# Patient Record
Sex: Female | Born: 2000 | Race: White | Hispanic: No | Marital: Single | Smoking: Never smoker
Health system: Southern US, Community
[De-identification: ages and names within clinical notes are randomized; demographics above are authoritative.]

## PROBLEM LIST (undated history)

## (undated) DIAGNOSIS — J45909 Unspecified asthma, uncomplicated: Secondary | ICD-10-CM

## (undated) DIAGNOSIS — J189 Pneumonia, unspecified organism: Secondary | ICD-10-CM

## (undated) HISTORY — DX: Pneumonia, unspecified organism: J18.9

---
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
## 2021-06-20 ENCOUNTER — Ambulatory Visit: Payer: 59 | Admitting: Osteopathic Medicine

## 2021-06-27 ENCOUNTER — Ambulatory Visit (INDEPENDENT_AMBULATORY_CARE_PROVIDER_SITE_OTHER): Payer: BC Managed Care – PPO

## 2021-06-27 ENCOUNTER — Other Ambulatory Visit: Payer: Self-pay

## 2021-06-27 ENCOUNTER — Ambulatory Visit (INDEPENDENT_AMBULATORY_CARE_PROVIDER_SITE_OTHER): Payer: BC Managed Care – PPO | Admitting: Sports Medicine

## 2021-06-27 DIAGNOSIS — M25551 Pain in right hip: Secondary | ICD-10-CM

## 2021-06-27 DIAGNOSIS — M25552 Pain in left hip: Secondary | ICD-10-CM | POA: Diagnosis not present

## 2021-06-27 DIAGNOSIS — Q742 Other congenital malformations of lower limb(s), including pelvic girdle: Secondary | ICD-10-CM

## 2021-06-27 DIAGNOSIS — G8929 Other chronic pain: Secondary | ICD-10-CM

## 2021-06-27 DIAGNOSIS — M25511 Pain in right shoulder: Secondary | ICD-10-CM

## 2021-06-27 DIAGNOSIS — M357 Hypermobility syndrome: Secondary | ICD-10-CM

## 2021-06-27 NOTE — Assessment & Plan Note (Signed)
Beighton score = 6, discussed the importance of strengthening exercises.

## 2021-06-27 NOTE — Progress Notes (Signed)
    Procedures performed today:    None.  Independent interpretation of notes and tests performed by another provider:   None.  Brief History, Exam, Impression, and Recommendations:    Bilateral hip instability I do suspect this is part of the complex of her benign joint hypermobility, we discussed the importance of therapy and strengthening, adding bilateral hip x-rays, and we will have physical therapy work on her hips as well.  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 She does have a positive (6/9) Beighton score. We discussed the anatomy, I would like some x-rays and some aggressive physical therapy, we can recheck this in about 6 weeks and if still symptomatic we can do an MR arthrogram. She will let me know if she like a note limiting duty at work.  I spent 30 minutes of total time managing this patient today, this includes chart review, face to face, and non-face to face time.  ___________________________________________ Ihor Austin. Benjamin Stain, M.D., ABFM., CAQSM. Primary Care and Sports Medicine [facility]  Adjunct Instructor of Family Medicine  [employer]

## 2021-06-27 NOTE — Assessment & Plan Note (Signed)
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 She does have a positive (6/9) Beighton score. We discussed the anatomy, I would like some x-rays and some aggressive physical therapy, we can recheck this in about 6 weeks and if still symptomatic we can do an MR arthrogram. She will let me know if she like a note limiting duty at work.

## 2021-06-27 NOTE — Assessment & Plan Note (Signed)
I do suspect this is part of the complex of her benign joint hypermobility, we discussed the importance of therapy and strengthening, adding bilateral hip x-rays, and we will have physical therapy work on her hips as well.

## 2021-08-08 ENCOUNTER — Ambulatory Visit: Payer: Self-pay | Admitting: Sports Medicine

## 2021-08-09 ENCOUNTER — Ambulatory Visit (INDEPENDENT_AMBULATORY_CARE_PROVIDER_SITE_OTHER): Payer: BC Managed Care – PPO | Admitting: Family Medicine

## 2021-08-09 ENCOUNTER — Ambulatory Visit (INDEPENDENT_AMBULATORY_CARE_PROVIDER_SITE_OTHER): Payer: BC Managed Care – PPO

## 2021-08-09 ENCOUNTER — Other Ambulatory Visit: Payer: Self-pay

## 2021-08-09 ENCOUNTER — Encounter: Payer: Self-pay | Admitting: Family Medicine

## 2021-08-09 VITALS — BP 122/60 | HR 83 | Ht 65.0 in | Wt 134.0 lb

## 2021-08-09 DIAGNOSIS — R0602 Shortness of breath: Secondary | ICD-10-CM

## 2021-08-09 DIAGNOSIS — R0789 Other chest pain: Secondary | ICD-10-CM | POA: Diagnosis not present

## 2021-08-09 DIAGNOSIS — D509 Iron deficiency anemia, unspecified: Secondary | ICD-10-CM

## 2021-08-09 DIAGNOSIS — D649 Anemia, unspecified: Secondary | ICD-10-CM | POA: Diagnosis not present

## 2021-08-09 LAB — COMPLETE METABOLIC PANEL WITH GFR
AG Ratio: 1.8 (calc) (ref 1.0–2.5)
ALT: 12 U/L (ref 5–32)
AST: 23 U/L (ref 12–32)
Albumin: 4.8 g/dL (ref 3.6–5.1)
Alkaline phosphatase (APISO): 68 U/L (ref 36–128)
BUN: 12 mg/dL (ref 7–20)
CO2: 20 mmol/L (ref 20–32)
Calcium: 10.1 mg/dL (ref 8.9–10.4)
Chloride: 107 mmol/L (ref 98–110)
Creat: 0.74 mg/dL (ref 0.50–0.96)
Globulin: 2.6 g/dL (calc) (ref 2.0–3.8)
Glucose, Bld: 75 mg/dL (ref 65–99)
Potassium: 3.9 mmol/L (ref 3.8–5.1)
Sodium: 139 mmol/L (ref 135–146)
Total Bilirubin: 0.4 mg/dL (ref 0.2–1.1)
Total Protein: 7.4 g/dL (ref 6.3–8.2)
eGFR: 119 mL/min/{1.73_m2} (ref >=60)

## 2021-08-09 LAB — CBC
HCT: 36.3 % (ref 35.0–45.0)
Hemoglobin: 11.5 g/dL — ABNORMAL LOW (ref 11.7–15.5)
MCH: 25.3 pg — ABNORMAL LOW (ref 27.0–33.0)
MCHC: 31.7 g/dL — ABNORMAL LOW (ref 32.0–36.0)
MCV: 79.8 fL — ABNORMAL LOW (ref 80.0–100.0)
MPV: 11.6 fL (ref 7.5–12.5)
Platelets: 300 10*3/uL (ref 140–400)
RBC: 4.55 10*6/uL (ref 3.80–5.10)
RDW: 15.3 % — ABNORMAL HIGH (ref 11.0–15.0)
WBC: 6.7 10*3/uL (ref 3.8–10.8)

## 2021-08-09 LAB — IRON,TIBC AND FERRITIN PANEL
%SAT: 17 % (calc) (ref 15–45)
Ferritin: 13 ng/mL — ABNORMAL LOW (ref 16–154)
Iron: 80 ug/dL (ref 27–164)
TIBC: 462 mcg/dL (calc) — ABNORMAL HIGH (ref 271–448)

## 2021-08-09 LAB — TSH: TSH: 1.7 mIU/L

## 2021-08-09 LAB — POCT HEMOGLOBIN: Hemoglobin: 10.8 g/dL — AB (ref 11–14.6)

## 2021-08-09 LAB — D-DIMER, QUANTITATIVE: D-Dimer, Quant: 0.28 mcg/mL FEU (ref <0.50)

## 2021-08-09 NOTE — Progress Notes (Signed)
Acute Office Visit  Subjective:    Patient ID: Audrey Larson, female    DOB: [birth year], 20 y.o.   MRN: [id number]  Chief Complaint  Patient presents with   trouble breathing    HPI Patient is in today for Pt has hx of Iron deficiency anemia. She stated that the inhalers don't help her whenever she gets like this.   She said that this all began about 4 days ago. She said if feels like her lungs can't expand. She has upper back and chest pain because of trying to take deep breathes, she also gets lightheaded,shaky, and jittery. She decided to restart her iron tablets 2 days ago and she's been taking 2 tablets daily.  She denies any fever or upper respiratory symptoms.  She says she has an occasional cough nonproductive.  But not persistent.  She does have a history of heavy periods.  Her last period was late.  But she has not been sexually active.  Denies heart palpitations or heart pounding.  Past Medical History:  Diagnosis Date   Asthma    Pneumonia 2010    No past surgical history on file.  Family History  Problem Relation Age of Onset   Thyroid disease Mother     Social History   Socioeconomic History   Marital status: Single    Spouse name: Not on file   Number of children: Not on file   Years of education: Not on file   Highest education level: Not on file  Occupational History   Not on file  Tobacco Use   Smoking status: Never   Smokeless tobacco: Never  Vaping Use   Vaping Use: Never used  Substance and Sexual Activity   Alcohol use: No   Drug use: No   Sexual activity: Never  Other Topics Concern   Not on file  Social History Narrative   Not on file   Social Determinants of Health   Financial Resource Strain: Not on file  Food Insecurity: Not on file  Transportation Needs: Not on file  Physical Activity: Not on file  Stress: Not on file  Social Connections: Not on file  Intimate Partner Violence: Not on file    Outpatient Medications Prior to  Visit  Medication Sig Dispense Refill   albuterol (PROVENTIL HFA;VENTOLIN HFA) 108 (90 Base) MCG/ACT inhaler Inhale two puffs every 4-6 hours only as needed for shortness of breath or wheezing. (Patient not taking: Reported on 02/28/2019) 1 Inhaler 1   Iron-FA-B Cmp-C-Biot-Probiotic (FUSION PLUS) CAPS Take 1 capsule by mouth daily. (Patient not taking: No sig reported) 90 capsule 1   Mometasone Furoate 110 MCG/INH AEPB Inhale 1 Inhaler into the lungs 2 (two) times daily. 1 Inhaler 11   Norgestimate-Ethinyl Estradiol Triphasic (ORTHO TRI-CYCLEN LO) 0.18/0.215/0.25 MG-25 MCG tab Take 1 tablet by mouth daily. 84 tablet 3   No facility-administered medications prior to visit.    Allergies  Allergen Reactions   Ceftriaxone Rash   Ceftriaxone Sodium Rash    Review of Systems     Objective:    Physical Exam Constitutional:      Appearance: She is well-developed.  HENT:     Head: Normocephalic and atraumatic.  Eyes:     Conjunctiva/sclera: Conjunctivae normal.  Cardiovascular:     Rate and Rhythm: Normal rate and regular rhythm.     Heart sounds: Normal heart sounds.  Pulmonary:     Effort: Pulmonary effort is normal.     Breath sounds: Normal  breath sounds.  Skin:    General: Skin is warm and dry.  Neurological:     Mental Status: She is alert and oriented to person, place, and time.  Psychiatric:        Behavior: Behavior normal.    BP 122/60   Pulse 83   Ht 5\' 5"  (1.651 m)   Wt 134 lb (60.8 kg)   LMP 07/10/2021 (Approximate)   SpO2 100%   BMI 22.30 kg/m  Wt Readings from Last 3 Encounters:  08/09/21 134 lb (60.8 kg) (62 %, Z= 0.30)*  09/10/20 133 lb (60.3 kg) (64 %, Z= 0.37)*  02/28/19 118 lb (53.5 kg) (43 %, Z= -0.18)*   * Growth percentiles are based on CDC (Girls, 2-20 Years) data.    Health Maintenance Due  Topic Date Due   Pneumococcal Vaccine 66-110 Years old (1 - PCV) Never done    There are no preventive care reminders to display for this  patient.   Lab Results  Component Value Date   TSH 2.71 09/11/2020   Lab Results  Component Value Date   WBC 5.1 09/11/2020   HGB 10.8 (A) 08/09/2021   HCT 39.6 09/11/2020   MCV 85.0 09/11/2020   PLT 299 09/11/2020   Lab Results  Component Value Date   NA 140 09/11/2020   K 4.1 09/11/2020   CO2 24 09/11/2020   GLUCOSE 90 09/11/2020   BUN 9 09/11/2020   CREATININE 0.67 09/11/2020   BILITOT 0.5 09/11/2020   AST 17 09/11/2020   ALT 11 09/11/2020   PROT 7.2 09/11/2020   CALCIUM 9.8 09/11/2020   No results found for: CHOL No results found for: HDL No results found for: LDLCALC No results found for: TRIG No results found for: CHOLHDL No results found for: 09/13/2020     Assessment & Plan:   Problem List Items Addressed This Visit       Other   Iron deficiency anemia - Primary   Relevant Orders   POCT hemoglobin (Completed)   Fe+TIBC+Fer   CBC   D-Dimer, Quantitative   TSH   DG Chest 2 View   COMPLETE METABOLIC PANEL WITH GFR   Other Visit Diagnoses     SOB (shortness of breath)       Relevant Orders   Fe+TIBC+Fer   CBC   D-Dimer, Quantitative   TSH   DG Chest 2 View   COMPLETE METABOLIC PANEL WITH GFR   Chest tightness       Relevant Orders   Fe+TIBC+Fer   CBC   D-Dimer, Quantitative   TSH   DG Chest 2 View   COMPLETE METABOLIC PANEL WITH GFR      Shortness of breath-most likely secondary to some component of anxiety in addition to iron deficiency.  Spot hemoglobin today around 10.  We will get full labs for further evaluation her last ferritin was very low so I suspect her iron stores are significantly depleted.  We will check a D-dimer though overall I feel like she is low risk no recent travel and she is not currently on birth control.  We will also check for thyroid disorder.  She says her mom has a history of thyroid problems that required surgery.  Also get a chest x-ray to fully rule out any other underlying causes such as infection etc.  No  orders of the defined types were placed in this encounter.    [id number], MD

## 2021-08-09 NOTE — Patient Instructions (Signed)
Please continue your iron.  You may need to take it with a stool softener if you feel like it starting to cause some constipation.  We will call you with the lab results and imaging as soon as we get it back.

## 2021-08-09 NOTE — Progress Notes (Signed)
Pt has hx of Iron deficiency anemia. She stated that the inhalers don't help her whenever she gets like this.  She said that this all began about 4 days ago. She said if feels like her lungs can't expand. She has upper back and chest pain because of trying to take deep breathes, she also gets lightheaded,shaky, and jittery. She decided to restart her iron tablets 2 days ago and she's been taking 2 tablets daily.

## 2021-08-12 NOTE — Progress Notes (Signed)
Your chest xray is normal.  Hopefully you are feeling better.

## 2021-08-12 NOTE — Progress Notes (Signed)
Hi Audrey Larson, your iron is still low but it is better than it was last November.  No sign of a blood clot. Thyrid is normal.  Metabolic panel is normal.

## 2022-02-19 ENCOUNTER — Ambulatory Visit (INDEPENDENT_AMBULATORY_CARE_PROVIDER_SITE_OTHER): Payer: BC Managed Care – PPO

## 2022-02-19 ENCOUNTER — Encounter: Payer: Self-pay | Admitting: Physician Assistant

## 2022-02-19 ENCOUNTER — Ambulatory Visit (INDEPENDENT_AMBULATORY_CARE_PROVIDER_SITE_OTHER): Payer: BC Managed Care – PPO | Admitting: Physician Assistant

## 2022-02-19 VITALS — BP 115/71 | HR 83 | Ht 65.0 in | Wt 135.0 lb

## 2022-02-19 DIAGNOSIS — F331 Major depressive disorder, recurrent, moderate: Secondary | ICD-10-CM | POA: Diagnosis not present

## 2022-02-19 DIAGNOSIS — R1902 Left upper quadrant abdominal swelling, mass and lump: Secondary | ICD-10-CM | POA: Diagnosis not present

## 2022-02-19 DIAGNOSIS — R110 Nausea: Secondary | ICD-10-CM

## 2022-02-19 DIAGNOSIS — F419 Anxiety disorder, unspecified: Secondary | ICD-10-CM | POA: Diagnosis not present

## 2022-02-19 DIAGNOSIS — D500 Iron deficiency anemia secondary to blood loss (chronic): Secondary | ICD-10-CM

## 2022-02-19 DIAGNOSIS — R112 Nausea with vomiting, unspecified: Secondary | ICD-10-CM

## 2022-02-19 LAB — POCT HEMOGLOBIN: Hemoglobin: 9.2 g/dL — AB (ref 11–14.6)

## 2022-02-19 MED ORDER — OMEPRAZOLE 40 MG PO CPDR: 40.0000 mg | DELAYED_RELEASE_CAPSULE | Freq: Every day | ORAL | 0 refills | Status: DC

## 2022-02-19 NOTE — Progress Notes (Signed)
Ultrasound was not able to give [place] enough information on what this is. CT ordered of abdomen.

## 2022-02-19 NOTE — Progress Notes (Signed)
? ?Established Patient Office Visit ? ?Subjective   ?Patient ID: Audrey Larson, female    DOB: 03/31/2001  Age: 21 y.o. MRN: [id number] ? ?Chief Complaint  ?Patient presents with  ? Mental Health Problem  ? Nausea  ? ? ?HPI ?Pt is a 21 yo female who presents to the clinic for referral and to discuss nausea.  ? ?She has had ongoing anxiety and depression for years but her boyfriend just now convienced her to consider counseling.  ? ?She also has had nausea everytime she eats for years. No known food triggers. Never seen GI. Denies any vomiting, blood in stool, cough, reflux, abdominal pain. Bowel movements are daily and soft. She does have some upper abdominal tenderness but no pain.  ? ?Known IDA. Not taking iron.  ? ? ?Patient Active Problem List  ? Diagnosis Date Noted  ? Nausea 02/19/2022  ? Left upper quadrant abdominal mass 02/19/2022  ? Anxiety 02/19/2022  ? Moderate episode of recurrent major depressive disorder (HCC) 02/19/2022  ? 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 Chronic right shoulder pain 06/27/2021  ? Bilateral hip instability 06/27/2021  ? Iron deficiency anemia 01/31/2019  ? Asthma, chronic 06/21/2014  ? ?Past Medical History:  ?Diagnosis Date  ? Asthma   ? Pneumonia 2010  ? ?Family History  ?Problem Relation Age of Onset  ? Thyroid disease Mother   ? ?Allergies  ?Allergen Reactions  ? Ceftriaxone Rash  ? Ceftriaxone Sodium Rash  ? ?  ? ? ?ROS ?See HPI.  ?  ?Objective:  ?  ? ?BP 115/71   Pulse 83   Ht 5\' 5"  (1.651 m)   Wt 135 lb (61.2 kg)   SpO2 100%   BMI 22.47 kg/m?  ?BP Readings from Last 3 Encounters:  ?02/19/22 115/71  ?08/09/21 122/60  ?09/10/20 118/79  ? ?  ? ?Physical Exam ?Vitals reviewed.  ?Constitutional:   ?   Appearance: Normal appearance.  ?   Comments: Pale appearance  ?HENT:  ?   Head: Normocephalic.  ?Neck:  ?   Vascular: No carotid bruit.  ?Cardiovascular:  ?   Rate and Rhythm: Normal rate.  ?Pulmonary:  ?   Effort: Pulmonary effort is normal.  ?Abdominal:  ?   General:  Bowel sounds are normal. There is no distension.  ?   Palpations: Abdomen is soft. There is mass.  ?   Tenderness: There is abdominal tenderness. There is no right CVA tenderness, left CVA tenderness, guarding or rebound.  ?   Hernia: No hernia is present.  ?   Comments: Acorn like firm mass of left upper quadrant ?Epigastric tenderness to palpation across into left upper quadrant  ?Musculoskeletal:  ?   Cervical back: No tenderness.  ?   Right lower leg: No edema.  ?   Left lower leg: No edema.  ?Lymphadenopathy:  ?   Cervical: No cervical adenopathy.  ?Skin: ?   Coloration: Skin is pale.  ?Neurological:  ?   General: No focal deficit present.  ?   Mental Status: She is alert and oriented to person, place, and time.  ?Psychiatric:     ?   Mood and Affect: Mood normal.  ? ? ?.. ? ?  02/19/2022  ? 10:53 AM 08/09/2021  ?  3:38 PM 01/26/2019  ? 11:48 AM  ?Depression screen PHQ 2/9  ?Decreased Interest 3 0 1  ?Down, Depressed, Hopeless 3 0 1  ?PHQ - 2 Score 6 0 2  ?Altered sleeping 3  3  ?  Tired, decreased energy 3  1  ?Change in appetite 3  0  ?Feeling bad or failure about yourself  2  1  ?Trouble concentrating 3  2  ?Moving slowly or fidgety/restless 3  3  ?Suicidal thoughts 1  0  ?PHQ-9 Score 24  12  ?Difficult doing work/chores Very difficult  Somewhat difficult  ? ?.. ? ?  02/19/2022  ? 10:54 AM 01/26/2019  ? 11:49 AM  ?GAD 7 : Generalized Anxiety Score  ?Nervous, Anxious, on Edge 3 2  ?Control/stop worrying 3 1  ?Worry too much - different things 3 2  ?Trouble relaxing 2 3  ?Restless 3 3  ?Easily annoyed or irritable 3 3  ?Afraid - awful might happen 2 2  ?Total GAD 7 Score 19 16  ?Anxiety Difficulty Very difficult Somewhat difficult  ? ? ?.. ?Results for orders placed or performed in visit on 02/19/22  ?POCT hemoglobin  ?Result Value Ref Range  ? Hemoglobin 9.2 (A) 11 - 14.6 g/dL  ? ? ?  ?Assessment & Plan:  ?..Audrey Larson was seen today for mental health problem and nausea. ? ?Diagnoses and all orders for this  visit: ? ?Anxiety ?-     Ambulatory referral to Psychology ? ?Left upper quadrant abdominal mass ?-     US Abdomen Limited; Future ?-     POCT hemoglobin ? ?Nausea ?-     omeprazole (PRILOSEC) 40 MG capsule; Take 1 capsule (40 mg total) by mouth daily. ?-     POCT hemoglobin ? ?Moderate episode of recurrent major depressive disorder (HCC) ?-     Ambulatory referral to Psychology ? ?Iron deficiency anemia due to chronic blood loss ? ?PHQ/GAD not to goal ?Pt ok with counseling ?Discussed medication and declined for today ?Discussed 988 if needs to talk to someone immediately ?Discussed [facility] in [place]  ?Referral placed ? ?IDA ?9.2 ?Add iron back in and recheck in 4-6 weeks ?Increase iron rich foods ?Consider birth control that stops menstrual cycle ? ??nausea while and after eating ?Trial of omeprazole despite no overwhelming symptoms of reflux ?If no improving consider GI referral ? ?LUQ mass found on exam today ?U/S ordered for evaluation ? ? ?Spent 40 minutes with patient reviewing chart, discussing symptoms, discussing work up and treatment plan and follow up.  ? ? ? ?Return in about 2 weeks (around 03/05/2022).  ? ? ?Tandy Gaw, PA-C ? ?

## 2022-02-19 NOTE — Addendum Note (Signed)
Addended by: Donella Stade on: 02/19/2022 03:08 PM ? ? Modules accepted: Orders ? ?

## 2022-02-19 NOTE — Patient Instructions (Signed)
Will get ultrasound ?Omeprazole daily in morning for 2 weeks  ?Follow up in 2 weeks ?

## 2022-02-24 ENCOUNTER — Ambulatory Visit (INDEPENDENT_AMBULATORY_CARE_PROVIDER_SITE_OTHER): Payer: BC Managed Care – PPO

## 2022-02-24 DIAGNOSIS — R112 Nausea with vomiting, unspecified: Secondary | ICD-10-CM

## 2022-02-24 DIAGNOSIS — R10812 Left upper quadrant abdominal tenderness: Secondary | ICD-10-CM | POA: Diagnosis not present

## 2022-02-24 DIAGNOSIS — R1110 Vomiting, unspecified: Secondary | ICD-10-CM | POA: Diagnosis not present

## 2022-02-24 MED ORDER — IOHEXOL 300 MG/ML  SOLN: 80.0000 mL | Freq: Once | INTRAMUSCULAR | Status: DC | PRN

## 2022-02-24 MED ORDER — IOHEXOL 300 MG/ML  SOLN
100.0000 mL | Freq: Once | INTRAMUSCULAR | Status: AC | PRN
  Administered 2022-02-24: 100 mL via INTRAVENOUS

## 2022-02-26 NOTE — Progress Notes (Signed)
GREAT news. All your organs look great. You do have a 0000000 follicle on ovary which is normal. You do have a small umbilical hernia. We can watch this and make sure not causing any pain or getting bigger.

## 2022-03-05 ENCOUNTER — Ambulatory Visit (INDEPENDENT_AMBULATORY_CARE_PROVIDER_SITE_OTHER): Payer: BC Managed Care – PPO | Admitting: Physician Assistant

## 2022-03-05 ENCOUNTER — Encounter: Payer: Self-pay | Admitting: Physician Assistant

## 2022-03-05 VITALS — BP 118/75 | HR 96 | Ht 65.0 in | Wt 132.0 lb

## 2022-03-05 DIAGNOSIS — F419 Anxiety disorder, unspecified: Secondary | ICD-10-CM

## 2022-03-05 DIAGNOSIS — K429 Umbilical hernia without obstruction or gangrene: Secondary | ICD-10-CM | POA: Insufficient documentation

## 2022-03-05 DIAGNOSIS — R110 Nausea: Secondary | ICD-10-CM | POA: Diagnosis not present

## 2022-03-05 DIAGNOSIS — G43019 Migraine without aura, intractable, without status migrainosus: Secondary | ICD-10-CM | POA: Diagnosis not present

## 2022-03-05 MED ORDER — RIZATRIPTAN BENZOATE 10 MG PO TABS: 10.0000 mg | ORAL_TABLET | ORAL | 2 refills | Status: DC | PRN

## 2022-03-05 NOTE — Patient Instructions (Addendum)
[place] ?Calm aid lavendar ?Ashwaganda ?SSRI- zoloft/celexa/lexapro  ?GI referral ?Psychiatry ? ?Migraine Headache ?A migraine headache is an intense, throbbing pain on one side or both sides of the head. Migraine headaches may also cause other symptoms, such as nausea, vomiting, and sensitivity to light and noise. A migraine headache can last from 4 hours to 3 days. Talk with your doctor about what things may bring on (trigger) your migraine headaches. ?What are the causes? ?The exact cause of this condition is not known. However, a migraine may be caused when nerves in the brain become irritated and release chemicals that cause inflammation of blood vessels. This inflammation causes pain. This condition may be triggered or caused by: ?Drinking alcohol. ?Smoking. ?Taking medicines, such as: ?Medicine used to treat chest pain (nitroglycerin). ?Birth control pills. ?Estrogen. ?Certain blood pressure medicines. ?Eating or drinking products that contain nitrates, glutamate, aspartame, or tyramine. Aged cheeses, chocolate, or caffeine may also be triggers. ?Doing physical activity. ?Other things that may trigger a migraine headache include: ?Menstruation. ?Pregnancy. ?Hunger. ?Stress. ?Lack of sleep or too much sleep. ?Weather changes. ?Fatigue. ?What increases the risk? ?The following factors may make you more likely to experience migraine headaches: ?Being a certain age. This condition is more common in people who are 72-55 years old. ?Being female. ?Having a family history of migraine headaches. ?Being Caucasian. ?Having a mental health condition, such as depression or anxiety. ?Being obese. ?What are the signs or symptoms? ?The main symptom of this condition is pulsating or throbbing pain. This pain may: ?Happen in any area of the head, such as on one side or both sides. ?Interfere with daily activities. ?Get worse with physical activity. ?Get worse with exposure to bright lights or loud noises. ?Other  symptoms may include: ?Nausea. ?Vomiting. ?Dizziness. ?General sensitivity to bright lights, loud noises, or smells. ?Before you get a migraine headache, you may get warning signs (an aura). An aura may include: ?Seeing flashing lights or having blind spots. ?Seeing bright spots, halos, or zigzag lines. ?Having tunnel vision or blurred vision. ?Having numbness or a tingling feeling. ?Having trouble talking. ?Having muscle weakness. ?Some people have symptoms after a migraine headache (postdromal phase), such as: ?Feeling tired. ?Difficulty concentrating. ?How is this diagnosed? ?A migraine headache can be diagnosed based on: ?Your symptoms. ?A physical exam. ?Tests, such as: ?CT scan or an MRI of the head. These imaging tests can help rule out other causes of headaches. ?Taking fluid from the spine (lumbar puncture) and analyzing it (cerebrospinal fluid analysis, or CSF analysis). ?How is this treated? ?This condition may be treated with medicines that: ?Relieve pain. ?Relieve nausea. ?Prevent migraine headaches. ?Treatment for this condition may also include: ?Acupuncture. ?Lifestyle changes like avoiding foods that trigger migraine headaches. ?Biofeedback. ?Cognitive behavioral therapy. ?Follow these instructions at home: ?Medicines ?Take over-the-counter and prescription medicines only as told by your health care provider. ?Ask your health care provider if the medicine prescribed to you: ?Requires you to avoid driving or using heavy machinery. ?Can cause constipation. You may need to take these actions to prevent or treat constipation: ?Drink enough fluid to keep your urine pale yellow. ?Take over-the-counter or prescription medicines. ?Eat foods that are high in fiber, such as beans, whole grains, and fresh fruits and vegetables. ?Limit foods that are high in fat and processed sugars, such as fried or sweet foods. ?Lifestyle ?Do not drink alcohol. ?Do not use any products that contain nicotine or tobacco, such  as cigarettes, e-cigarettes, and chewing tobacco.  If you need help quitting, ask your health care provider. ?Get at least 8 hours of sleep every night. ?Find ways to manage stress, such as meditation, deep breathing, or yoga. ?General instructions ? ?  ? ?Keep a journal to find out what may trigger your migraine headaches. For example, write down: ?What you eat and drink. ?How much sleep you get. ?Any change to your diet or medicines. ?If you have a migraine headache: ?Avoid things that make your symptoms worse, such as bright lights. ?It may help to lie down in a dark, quiet room. ?Do not drive or use heavy machinery. ?Ask your health care provider what activities are safe for you while you are experiencing symptoms. ?Keep all follow-up visits as told by your health care provider. This is important. ?Contact a health care provider if: ?You develop symptoms that are different or more severe than your usual migraine headache symptoms. ?You have more than 15 headache days in one month. ?Get help right away if: ?Your migraine headache becomes severe. ?Your migraine headache lasts longer than 72 hours. ?You have a fever. ?You have a stiff neck. ?You have vision loss. ?Your muscles feel weak or like you cannot control them. ?You start to lose your balance often. ?You have trouble walking. ?You faint. ?You have a seizure. ?Summary ?A migraine headache is an intense, throbbing pain on one side or both sides of the head. Migraines may also cause other symptoms, such as nausea, vomiting, and sensitivity to light and noise. ?This condition may be treated with medicines and lifestyle changes. You may also need to avoid certain things that trigger a migraine headache. ?Keep a journal to find out what may trigger your migraine headaches. ?Contact your health care provider if you have more than 15 headache days in a month or you develop symptoms that are different or more severe than your usual migraine headache symptoms. ?This  information is not intended to replace advice given to you by your health care provider. Make sure you discuss any questions you have with your health care provider. ?Document Revised: 01/28/2019 Document Reviewed: 11/18/2018 ?Elsevier Patient Education ? [place]. ? ? ?Generalized Anxiety Disorder, Adult ?Generalized anxiety disorder (GAD) is a mental health condition. Unlike normal worries, anxiety related to GAD is not triggered by a specific event. These worries do not fade or get better with time. GAD interferes with relationships, work, and school. ?GAD symptoms can vary from mild to severe. People with severe GAD can have intense waves of anxiety with physical symptoms that are similar to panic attacks. ?What are the causes? ?The exact cause of GAD is not known, but the following are believed to have an impact: ?Differences in natural brain chemicals. ?Genes passed down from parents to children. ?Differences in the way threats are perceived. ?Development and stress during childhood. ?Personality. ?What increases the risk? ?The following factors may make you more likely to develop this condition: ?Being female. ?Having a family history of anxiety disorders. ?Being very shy. ?Experiencing very stressful life events, such as the death of a loved one. ?Having a very stressful family environment. ?What are the signs or symptoms? ?People with GAD often worry excessively about many things in their lives, such as their health and family. Symptoms may also include: ?Mental and emotional symptoms: ?Worrying excessively about natural disasters. ?Fear of being late. ?Difficulty concentrating. ?Fears that others are judging your performance. ?Physical symptoms: ?Fatigue. ?Headaches, muscle tension, muscle twitches, trembling, or feeling shaky. ?Feeling like your heart  is pounding or beating very fast. ?Feeling out of breath or like you cannot take a deep breath. ?Having trouble falling asleep or staying asleep,  or experiencing restlessness. ?Sweating. ?Nausea, diarrhea, or irritable bowel syndrome (IBS). ?Behavioral symptoms: ?Experiencing erratic moods or irritability. ?Avoidance of new situations. ?Avoidance of pe

## 2022-03-05 NOTE — Progress Notes (Signed)
Established Patient Office Visit  Subjective   Patient ID: Audrey Larson, female    DOB: 2000/11/27  Age: 21 y.o. MRN: [id number]  Chief Complaint  Patient presents with   Follow-up    HPI Pt is a 21 yo female who presents to the clinic to follow up on nausea.   Her nausea is no better. She stopped omeprazole after 4 days. No benefit. No bowel changes. She denies constipation or diarrhea. Continues to have nausea after eating. US/CT showed umbilical hernia. No pain or symptoms there.   She continues to struggle with anxiety. She has for many years. Not tried counseling or medication.   She is getting right sided migraines that start at back of head and move forward. Excedrin/tylenol and sleep help rescue. She has started having 3-4 week in the last 6 months.    .. Active Ambulatory Problems    Diagnosis Date Noted   Asthma, chronic 06/21/2014   Iron deficiency anemia 01/31/2019   Benign joint hypermobility 06/27/2021   Chronic right shoulder pain 06/27/2021   Bilateral hip instability 06/27/2021   Nausea 02/19/2022   Left upper quadrant abdominal mass 02/19/2022   Anxiety 02/19/2022   Moderate episode of recurrent major depressive disorder ([place]) 99991111   Umbilical hernia without obstruction and without gangrene 03/05/2022   Resolved Ambulatory Problems    Diagnosis Date Noted   Closed fracture of third metacarpal bone of right hand 08/06/2015   Past Medical History:  Diagnosis Date   Asthma    Pneumonia 2010    ROS See HPI.    Objective:     BP 118/75   Pulse 96   Ht 5\' 5"  (1.651 m)   Wt 132 lb (59.9 kg)   LMP 02/07/2022   SpO2 100%   BMI 21.97 kg/m  BP Readings from Last 3 Encounters:  03/05/22 118/75  02/19/22 115/71  08/09/21 122/60      Physical Exam Vitals reviewed.  Constitutional:      Appearance: Normal appearance.  Cardiovascular:     Rate and Rhythm: Normal rate.  Pulmonary:     Effort: Pulmonary effort is normal.  Neurological:      Mental Status: She is alert and oriented to person, place, and time.  Psychiatric:        Mood and Affect: Mood normal.  ..    02/19/2022   10:53 AM 08/09/2021    3:38 PM 01/26/2019   11:48 AM  Depression screen PHQ 2/9  Decreased Interest 3 0 1  Down, Depressed, Hopeless 3 0 1  PHQ - 2 Score 6 0 2  Altered sleeping 3  3  Tired, decreased energy 3  1  Change in appetite 3  0  Feeling bad or failure about yourself  2  1  Trouble concentrating 3  2  Moving slowly or fidgety/restless 3  3  Suicidal thoughts 1  0  PHQ-9 Score 24  12  Difficult doing work/chores Very difficult  Somewhat difficult   ..    02/19/2022   10:54 AM 01/26/2019   11:49 AM  GAD 7 : Generalized Anxiety Score  Nervous, Anxious, on Edge 3 2  Control/stop worrying 3 1  Worry too much - different things 3 2  Trouble relaxing 2 3  Restless 3 3  Easily annoyed or irritable 3 3  Afraid - awful might happen 2 2  Total GAD 7 Score 19 16  Anxiety Difficulty Very difficult Somewhat difficult  Assessment & Plan:  Marland KitchenMarland KitchenHailey was seen today for follow-up.  Diagnoses and all orders for this visit:  Anxiety  Umbilical hernia without obstruction and without gangrene  Nausea  Intractable migraine without aura and without status migrainosus -     rizatriptan (MAXALT) 10 MG tablet; Take 1 tablet (10 mg total) by mouth as needed for migraine. May repeat in 2 hours if needed   Discussed anxiety Discussed natural options such as calm aid, ashwaganda, exercise Referral placed for counseling Discussed SSRI treatment  Unclear etiology of nausea Referral to GI  Did not stay on omeprazole for 2 week She could try that as well  Migraines could be stress/anxiety induced Start magnesium at bedtime Maxalt for rescue Follow up in 4-6 week    Spent 30 minutes with patient and patients boyfriend discussing symptoms and work up and prior results.    Iran Planas, PA-C

## 2022-03-14 ENCOUNTER — Other Ambulatory Visit: Payer: Self-pay | Admitting: Physician Assistant

## 2022-03-14 DIAGNOSIS — R110 Nausea: Secondary | ICD-10-CM

## 2022-03-26 ENCOUNTER — Encounter: Payer: Self-pay | Admitting: Physician Assistant

## 2022-03-28 MED ORDER — AMITRIPTYLINE HCL 25 MG PO TABS: 25.0000 mg | ORAL_TABLET | Freq: Every day | ORAL | 1 refills | Status: DC

## 2022-04-10 DIAGNOSIS — F411 Generalized anxiety disorder: Secondary | ICD-10-CM | POA: Diagnosis not present

## 2022-04-11 DIAGNOSIS — F4310 Post-traumatic stress disorder, unspecified: Secondary | ICD-10-CM | POA: Diagnosis not present

## 2022-04-11 DIAGNOSIS — F429 Obsessive-compulsive disorder, unspecified: Secondary | ICD-10-CM | POA: Diagnosis not present

## 2022-04-11 DIAGNOSIS — F319 Bipolar disorder, unspecified: Secondary | ICD-10-CM | POA: Diagnosis not present

## 2022-04-18 ENCOUNTER — Other Ambulatory Visit: Payer: Self-pay | Admitting: Physician Assistant

## 2022-04-18 DIAGNOSIS — R110 Nausea: Secondary | ICD-10-CM

## 2022-04-19 ENCOUNTER — Other Ambulatory Visit: Payer: Self-pay | Admitting: Physician Assistant

## 2022-04-26 ENCOUNTER — Other Ambulatory Visit: Payer: Self-pay | Admitting: Physician Assistant

## 2022-04-26 DIAGNOSIS — R110 Nausea: Secondary | ICD-10-CM

## 2022-05-02 DIAGNOSIS — F411 Generalized anxiety disorder: Secondary | ICD-10-CM | POA: Diagnosis not present

## 2022-05-09 DIAGNOSIS — F429 Obsessive-compulsive disorder, unspecified: Secondary | ICD-10-CM | POA: Diagnosis not present

## 2022-05-09 DIAGNOSIS — F4310 Post-traumatic stress disorder, unspecified: Secondary | ICD-10-CM | POA: Diagnosis not present

## 2022-05-09 DIAGNOSIS — F319 Bipolar disorder, unspecified: Secondary | ICD-10-CM | POA: Diagnosis not present

## 2022-05-27 DIAGNOSIS — F4310 Post-traumatic stress disorder, unspecified: Secondary | ICD-10-CM | POA: Diagnosis not present

## 2022-05-27 DIAGNOSIS — F429 Obsessive-compulsive disorder, unspecified: Secondary | ICD-10-CM | POA: Diagnosis not present

## 2022-07-12 ENCOUNTER — Other Ambulatory Visit: Payer: Self-pay

## 2022-07-12 ENCOUNTER — Ambulatory Visit: Admit: 2022-07-12 | Payer: BC Managed Care – PPO

## 2022-07-12 ENCOUNTER — Ambulatory Visit
Admission: EM | Admit: 2022-07-12 | Discharge: 2022-07-12 | Disposition: A | Discharge status: Home / Self Care | Payer: BC Managed Care – PPO | Attending: Urgent Care | Admitting: Urgent Care

## 2022-07-12 DIAGNOSIS — R12 Heartburn: Secondary | ICD-10-CM | POA: Diagnosis not present

## 2022-07-12 DIAGNOSIS — J069 Acute upper respiratory infection, unspecified: Secondary | ICD-10-CM | POA: Diagnosis not present

## 2022-07-12 DIAGNOSIS — J45909 Unspecified asthma, uncomplicated: Secondary | ICD-10-CM | POA: Insufficient documentation

## 2022-07-12 DIAGNOSIS — B9789 Other viral agents as the cause of diseases classified elsewhere: Secondary | ICD-10-CM | POA: Diagnosis not present

## 2022-07-12 DIAGNOSIS — Z20822 Contact with and (suspected) exposure to covid-19: Secondary | ICD-10-CM | POA: Insufficient documentation

## 2022-07-12 DIAGNOSIS — J029 Acute pharyngitis, unspecified: Secondary | ICD-10-CM | POA: Diagnosis not present

## 2022-07-12 DIAGNOSIS — R059 Cough, unspecified: Secondary | ICD-10-CM | POA: Insufficient documentation

## 2022-07-12 DIAGNOSIS — J028 Acute pharyngitis due to other specified organisms: Secondary | ICD-10-CM | POA: Diagnosis not present

## 2022-07-12 MED ORDER — LIDOCAINE VISCOUS HCL 2 % MT SOLN: 5.0000 mL | Freq: Four times a day (QID) | OROMUCOSAL | 0 refills | Status: DC | PRN

## 2022-07-12 NOTE — Discharge Instructions (Addendum)
Your symptoms sound most consistent with a viral illness. You were tested today for covid and flu. We will call you with the results and discuss treatment options. Supportive care is the mainstay of treatment - rest, hydration, frequent hand washing, tylenol or ibuprofen as needed for fever or aches.  Please use the mouthwash as needed; swish and swallow. Alternate tylenol with ibuprofen to help with fever and aches. Purchase OTC oscillococcinum to help with body aches. Rest. Avoid close contact with others.

## 2022-07-12 NOTE — ED Provider Notes (Signed)
[facility]    CSN: [id number] Arrival date & time: 07/12/22  1526      History   Chief Complaint Chief Complaint  Patient presents with   Cough   Fever   Sore Throat    HPI Audrey Larson is a 21 y.o. female.   Pleasant 22 year old female presents today due to concern of sore throat, fever, cough, body aches.  Patient states it started out with a sore throat on Tuesday.  Admits she had some heartburn symptoms at that time as well and thought it was just reflux causing the sore throat.  Wednesday, patient started having a headache and congestion.  Patient states yesterday she started having a fever with Tmax 101.8.  She has been alternating over-the-counter medications which have helped some with her temperature.  Patient also endorses an intermittently productive cough with yellow phlegm, rhinorrhea, and body aches.  Patient has had a decreased appetite and nausea, but no vomiting or diarrhea. Pt does have a hx of asthma and pneumonia in the past.    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 No pertinent surgical history.  OB History   No obstetric history on file.      Home Medications    Prior to Admission medications   Medication Sig Start Date End Date Taking? Authorizing Provider  magic mouthwash (lidocaine, diphenhydrAMINE, alum & mag  hydroxide) suspension Swish and swallow 5 mLs 4 (four) times daily as needed for mouth pain. 07/12/22  Yes Lindalou Soltis L, PA  amitriptyline (ELAVIL) 25 MG tablet Take 1 tablet (25 mg total) by mouth at bedtime. Appt for further refills 04/21/22   Iran Planas L, PA-C  lamoTRIgine (LAMICTAL) 100 MG tablet Take by mouth. 06/07/22   [provider]  Mometasone Furoate 110 MCG/INH AEPB Inhale 1 Inhaler into the lungs 2 (two) times daily. 01/24/19   Emeterio Reeve, DO  omeprazole (PRILOSEC) 40 MG capsule TAKE 1 CAPSULE (40 MG TOTAL) BY MOUTH DAILY. 04/28/22   Breeback, Jade L, PA-C  rizatriptan (MAXALT) 10 MG tablet Take 1 tablet (10 mg total) by mouth as needed for migraine. May repeat in 2 hours if needed 03/05/22   Donella Stade, PA-C    Family History Family History  Problem Relation Age of Onset   Thyroid disease Mother     Social History Social History   Tobacco Use   Smoking status: Never   Smokeless tobacco: Never  Vaping Use   Vaping Use: Never used  Substance Use Topics   Alcohol use: No   Drug use: No     Allergies   Ceftriaxone and Ceftriaxone sodium   Review of Systems Review of Systems  Constitutional:  Positive for fever.  Respiratory:  Positive for cough.      Physical Exam Triage Vital Signs ED Triage Vitals  Enc Vitals Group  BP 07/12/22 1538 136/79     Pulse Rate 07/12/22 1538 (!) 105     Resp 07/12/22 1538 18     Temp 07/12/22 1538 99 F (37.2 C)     Temp Source 07/12/22 1538 Oral     SpO2 07/12/22 1538 99 %     Weight --      Height --      Head Circumference --      Peak Flow --      Pain Score 07/12/22 1539 0     Pain Loc --      Pain Edu? --      Excl. in GC? --    No data found.  Updated Vital Signs BP 136/79 (BP Location: Left Arm)   Pulse (!) 105   Temp 99 F (37.2 C) (Oral)   Resp 18   LMP 07/12/2022   SpO2 99%   Visual Acuity Right Eye Distance:   Left Eye Distance:   Bilateral Distance:    Right Eye  Near:   Left Eye Near:    Bilateral Near:     Physical Exam Vitals and nursing note reviewed.  Constitutional:      General: She is not in acute distress.    Appearance: She is well-developed. She is ill-appearing. She is not toxic-appearing or diaphoretic.     Comments: Warm to touch  HENT:     Head: Normocephalic and atraumatic.     Right Ear: Tympanic membrane and ear canal normal. No drainage, swelling or tenderness. No middle ear effusion. Tympanic membrane is not erythematous.     Left Ear: Tympanic membrane and ear canal normal. No drainage, swelling or tenderness.  No middle ear effusion. Tympanic membrane is not erythematous.     Nose: Congestion and rhinorrhea present.     Mouth/Throat:     Mouth: Mucous membranes are moist. No oral lesions.     Pharynx: Oropharynx is clear. Uvula midline. No pharyngeal swelling, oropharyngeal exudate, posterior oropharyngeal erythema or uvula swelling.     Tonsils: No tonsillar exudate or tonsillar abscesses.  Eyes:     Conjunctiva/sclera: Conjunctivae normal.  Cardiovascular:     Rate and Rhythm: Regular rhythm. Tachycardia present.     Heart sounds: Normal heart sounds. No murmur heard.    No friction rub. No gallop.  Pulmonary:     Effort: Pulmonary effort is normal. No respiratory distress.     Breath sounds: Normal breath sounds. No stridor. No wheezing, rhonchi or rales.  Chest:     Chest wall: No tenderness.  Abdominal:     General: Bowel sounds are normal. There is no distension.     Palpations: Abdomen is soft.     Tenderness: There is no abdominal tenderness.  Musculoskeletal:        General: No swelling.     Cervical back: Neck supple.  Skin:    General: Skin is warm and dry.     Capillary Refill: Capillary refill takes less than 2 seconds.     Coloration: Skin is not pale.     Findings: No erythema or rash.  Neurological:     Mental Status: She is alert.  Psychiatric:        Mood and Affect: Mood normal.       UC Treatments / Results  Labs (all labs ordered are listed, but only abnormal results are displayed) Labs Reviewed  RESP PANEL BY RT-PCR (FLU A&B, COVID) ARPGX2    EKG  Radiology No results found.  Procedures Procedures (including critical care time)  Medications Ordered in UC Medications - No data to display  Initial Impression / Assessment and Plan / UC Course  I have reviewed the triage vital signs and the nursing notes.  Pertinent labs & imaging results that were available during my care of the patient were reviewed by me and considered in my medical decision making (see chart for details).     Viral URI with cough -symptoms sound consistent with viral infection, most likely COVID based on symptomatology.  PCR test sent out, patient is currently on day 4.  If positive, patient can start antiviral therapy tomorrow given pulmonary risk factors. Pharyngitis -viral, symptoms not consistent with strep.  Magic mouthwash called in for patient to swish and swallow.  Additional supportive measures reviewed.  Return to clinic precautions discussed.   Final Clinical Impressions(s) / UC Diagnoses   Final diagnoses:  Cough, unspecified type  Viral URI with cough  Acute viral pharyngitis     Discharge Instructions      Your symptoms sound most consistent with a viral illness. You were tested today for covid and flu. We will call you with the results and discuss treatment options. Supportive care is the mainstay of treatment - rest, hydration, frequent hand washing, tylenol or ibuprofen as needed for fever or aches.  Please use the mouthwash as needed; swish and swallow. Alternate tylenol with ibuprofen to help with fever and aches. Purchase OTC oscillococcinum to help with body aches. Rest. Avoid close contact with others.    ED Prescriptions     Medication Sig Dispense Auth. Provider   magic mouthwash (lidocaine, diphenhydrAMINE, alum & mag hydroxide)  suspension Swish and swallow 5 mLs 4 (four) times daily as needed for mouth pain. 200 mL Rhyleigh Grassel L, PA      PDMP not reviewed this encounter.   Maretta Bees, [place] 07/12/22 1643

## 2022-07-12 NOTE — ED Triage Notes (Signed)
Pt c/o cough, fever and sore throat. Tmax 101.8 this am. Dayquil prn.

## 2022-07-13 LAB — RESP PANEL BY RT-PCR (FLU A&B, COVID) ARPGX2
Influenza A by PCR: NEGATIVE
Influenza B by PCR: NEGATIVE
SARS Coronavirus 2 by RT PCR: NEGATIVE

## 2022-07-16 ENCOUNTER — Other Ambulatory Visit: Payer: Self-pay | Admitting: Physician Assistant

## 2022-07-30 ENCOUNTER — Other Ambulatory Visit: Payer: Self-pay | Admitting: Physician Assistant

## 2022-09-29 ENCOUNTER — Telehealth: Payer: Self-pay | Admitting: Physician Assistant

## 2022-09-29 ENCOUNTER — Encounter: Payer: Self-pay | Admitting: Emergency Medicine

## 2022-09-29 ENCOUNTER — Ambulatory Visit
Admission: EM | Admit: 2022-09-29 | Discharge: 2022-09-29 | Disposition: A | Discharge status: Home / Self Care | Payer: BC Managed Care – PPO | Attending: Family Medicine | Admitting: Family Medicine

## 2022-09-29 ENCOUNTER — Other Ambulatory Visit: Payer: Self-pay

## 2022-09-29 DIAGNOSIS — B349 Viral infection, unspecified: Secondary | ICD-10-CM | POA: Diagnosis not present

## 2022-09-29 DIAGNOSIS — J45909 Unspecified asthma, uncomplicated: Secondary | ICD-10-CM | POA: Diagnosis not present

## 2022-09-29 DIAGNOSIS — Z1152 Encounter for screening for COVID-19: Secondary | ICD-10-CM | POA: Diagnosis not present

## 2022-09-29 DIAGNOSIS — Z7951 Long term (current) use of inhaled steroids: Secondary | ICD-10-CM | POA: Diagnosis not present

## 2022-09-29 DIAGNOSIS — R058 Other specified cough: Secondary | ICD-10-CM | POA: Diagnosis not present

## 2022-09-29 NOTE — Telephone Encounter (Signed)
Patient called left vm stated her fever was running 104.00 ?and she thought she had the flu wanted to schedule an appointment today.

## 2022-09-29 NOTE — Telephone Encounter (Signed)
CC states she was calling patient, routing to her.

## 2022-09-29 NOTE — Telephone Encounter (Signed)
Called patient to schedule an appointment for 09-30-22 she is declined stated on her way to Urgent Care.

## 2022-09-29 NOTE — Discharge Instructions (Addendum)
Advised patient may take OTC Tylenol 1000 mg every 6 hours for fever and myalgias.  Advised we will follow-up with COVID-19/influenza results once received.

## 2022-09-29 NOTE — ED Triage Notes (Signed)
Fever 104, headache, body aches, vomited once, fatigue, x 1.5 days.

## 2022-09-29 NOTE — ED Provider Notes (Signed)
[facility]    CSN: [id number] Arrival date & time: 09/29/22  1631      History   Chief Complaint Chief Complaint  Patient presents with   Fever    HPI Audrey Larson is a 21 y.o. female.   HPI 36-YEAR-OLD FEMALE PRESENTS WITH FEVER, HEADACHE BODY ACHES AND FATIGUE FOR 1.5 DAYS PMH significant for asthma and pneumonia.  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 No pertinent surgical history.  OB History   No obstetric history on file.      Home Medications    Prior to Admission medications   Medication Sig Start Date End Date Taking? Authorizing Provider  amitriptyline (ELAVIL) 25 MG tablet TAKE 1 TABLET (25 MG TOTAL) BY MOUTH AT BEDTIME. APPT FOR FURTHER REFILLS 07/16/22   Jomarie Longs, PA-C  lamoTRIgine (LAMICTAL) 100 MG tablet Take by mouth. 06/07/22   [provider]  Mometasone Furoate 110 MCG/INH AEPB Inhale 1 Inhaler into the lungs 2 (two) times daily. 01/24/19   Sunnie Nielsen, DO    Family History Family History  Problem Relation Age of Onset   Thyroid disease Mother     Social History Social History   Tobacco Use   Smoking status: Never   Smokeless tobacco: Never  Vaping Use   Vaping Use: Never used  Substance Use Topics   Alcohol use: No   Drug use: No     Allergies   Ceftriaxone and Ceftriaxone sodium   Review of Systems Review of Systems  Constitutional:  Positive for chills and fever.   Musculoskeletal:  Positive for myalgias.  All other systems reviewed and are negative.    Physical Exam Triage Vital Signs ED Triage Vitals  Enc Vitals Group     BP      Pulse      Resp      Temp      Temp src      SpO2      Weight      Height      Head Circumference      Peak Flow      Pain Score      Pain Loc      Pain Edu?      Excl. in GC?    No data found.  Updated Vital Signs BP 117/71 (BP Location: Left Arm)   Pulse (!) 105   Temp 98.1 F (36.7 C) (Oral)   Resp 16   Ht 5\' 6"  (1.676 m)   Wt 135 lb (61.2 kg)   SpO2 97%   BMI 21.79 kg/m       Physical Exam Vitals and nursing note reviewed.  Constitutional:      General: She is not in acute distress.    Appearance: Normal appearance. She is normal weight. She is not ill-appearing.  HENT:     Head: Normocephalic and atraumatic.     Right Ear: Tympanic membrane, ear canal and external ear  normal.     Left Ear: Tympanic membrane, ear canal and external ear normal.     Mouth/Throat:     Mouth: Mucous membranes are moist.     Pharynx: Oropharynx is clear.  Eyes:     Extraocular Movements: Extraocular movements intact.     Conjunctiva/sclera: Conjunctivae normal.     Pupils: Pupils are equal, round, and reactive to light.  Cardiovascular:     Rate and Rhythm: Normal rate and regular rhythm.     Pulses: Normal pulses.     Heart sounds: Normal heart sounds. No murmur heard. Pulmonary:     Effort: Pulmonary effort is normal.     Breath sounds: Normal breath sounds. No wheezing, rhonchi or rales.  Musculoskeletal:        General: Normal range of motion.     Cervical back: Normal range of motion and neck supple.  Skin:    General: Skin is warm and dry.  Neurological:     General: No focal deficit present.     Mental Status: She is alert and oriented to person, place, and time. Mental status is at baseline.      UC Treatments / Results  Labs (all labs ordered are listed, but only abnormal results  are displayed) Labs Reviewed  RESP PANEL BY RT-PCR (FLU A&B, COVID) ARPGX2    EKG   Radiology No results found.  Procedures Procedures (including critical care time)  Medications Ordered in UC Medications - No data to display  Initial Impression / Assessment and Plan / UC Course  I have reviewed the triage vital signs and the nursing notes.  Pertinent labs & imaging results that were available during my care of the patient were reviewed by me and considered in my medical decision making (see chart for details).     MDM: 1.  Viral illness-lab 19147 ordered. Advised patient may take OTC Tylenol 1000 mg every 6 hours for fever and myalgias.  Advised may take Tessalon daily or as needed for cough.  Advised we will follow-up with COVID-19/influenza results once received.  Work note provided per patient request prior to discharge.  Patient discharged home, hemodynamically stable. Final Clinical Impressions(s) / UC Diagnoses   Final diagnoses:  Viral illness     Discharge Instructions      Advised patient may take OTC Tylenol 1000 mg every 6 hours for fever and myalgias.  Advised we will follow-up with COVID-19/influenza results once received.     ED Prescriptions   None    PDMP not reviewed this encounter.   Trevor Iha, FNP 09/29/22 1758

## 2022-09-30 ENCOUNTER — Telehealth: Payer: Self-pay

## 2022-09-30 DIAGNOSIS — N10 Acute tubulo-interstitial nephritis: Secondary | ICD-10-CM | POA: Diagnosis not present

## 2022-09-30 DIAGNOSIS — R1012 Left upper quadrant pain: Secondary | ICD-10-CM | POA: Diagnosis not present

## 2022-09-30 DIAGNOSIS — N3289 Other specified disorders of bladder: Secondary | ICD-10-CM | POA: Diagnosis not present

## 2022-09-30 DIAGNOSIS — R509 Fever, unspecified: Secondary | ICD-10-CM | POA: Diagnosis not present

## 2022-09-30 DIAGNOSIS — J45909 Unspecified asthma, uncomplicated: Secondary | ICD-10-CM | POA: Diagnosis not present

## 2022-09-30 DIAGNOSIS — R109 Unspecified abdominal pain: Secondary | ICD-10-CM | POA: Diagnosis not present

## 2022-09-30 DIAGNOSIS — R112 Nausea with vomiting, unspecified: Secondary | ICD-10-CM | POA: Diagnosis not present

## 2022-09-30 DIAGNOSIS — N390 Urinary tract infection, site not specified: Secondary | ICD-10-CM | POA: Diagnosis not present

## 2022-09-30 DIAGNOSIS — N12 Tubulo-interstitial nephritis, not specified as acute or chronic: Secondary | ICD-10-CM | POA: Diagnosis not present

## 2022-09-30 DIAGNOSIS — Z888 Allergy status to other drugs, medicaments and biological substances status: Secondary | ICD-10-CM | POA: Diagnosis not present

## 2022-09-30 DIAGNOSIS — R1032 Left lower quadrant pain: Secondary | ICD-10-CM | POA: Diagnosis not present

## 2022-09-30 LAB — RESP PANEL BY RT-PCR (FLU A&B, COVID) ARPGX2
Influenza A by PCR: NEGATIVE
Influenza B by PCR: NEGATIVE
SARS Coronavirus 2 by RT PCR: NEGATIVE

## 2022-09-30 NOTE — Telephone Encounter (Signed)
TC received from pt mother regarding recent results. Kelly was nearby and verified pt identifiers and gave permission for this nurse to discuss results with mother. Results shared, mother states pt is having sharp stabbing pain and fever. Mother states she is going to take pt to ED for further evaluation.

## 2023-01-12 ENCOUNTER — Ambulatory Visit (INDEPENDENT_AMBULATORY_CARE_PROVIDER_SITE_OTHER): Payer: No Typology Code available for payment source | Admitting: Family Medicine

## 2023-01-12 ENCOUNTER — Encounter: Payer: Self-pay | Admitting: Family Medicine

## 2023-01-12 VITALS — BP 156/78 | HR 70 | Temp 98.0°F | Ht 66.0 in | Wt 132.0 lb

## 2023-01-12 DIAGNOSIS — N3000 Acute cystitis without hematuria: Secondary | ICD-10-CM | POA: Diagnosis not present

## 2023-01-12 DIAGNOSIS — R300 Dysuria: Secondary | ICD-10-CM

## 2023-01-12 DIAGNOSIS — N923 Ovulation bleeding: Secondary | ICD-10-CM | POA: Diagnosis not present

## 2023-01-12 LAB — POCT URINE PREGNANCY: Preg Test, Ur: NEGATIVE

## 2023-01-12 LAB — POCT URINALYSIS DIP (CLINITEK)
Bilirubin, UA: NEGATIVE
Blood, UA: NEGATIVE
Glucose, UA: NEGATIVE mg/dL
Ketones, POC UA: NEGATIVE mg/dL
Nitrite, UA: POSITIVE — AB
POC PROTEIN,UA: NEGATIVE
Spec Grav, UA: 1.025 (ref 1.010–1.025)
Urobilinogen, UA: 0.2 E.U./dL
pH, UA: 7 (ref 5.0–8.0)

## 2023-01-12 MED ORDER — NITROFURANTOIN MONOHYD MACRO 100 MG PO CAPS: 100.0000 mg | ORAL_CAPSULE | Freq: Two times a day (BID) | ORAL | 0 refills | Status: AC

## 2023-01-12 NOTE — Assessment & Plan Note (Signed)
-   upt test: negative

## 2023-01-12 NOTE — Assessment & Plan Note (Signed)
Based on symptoms along with point-of-care UA we will go ahead and treat patient for UTI.  Did discuss urinating after intercourse.

## 2023-01-12 NOTE — Progress Notes (Signed)
Acute Office Visit  Subjective:     Patient ID: Audrey Larson, female    DOB: 26-Oct-2000, 22 y.o.   MRN: [id number]  Chief Complaint  Patient presents with   Dysuria    HPI Patient is in today for dysuria.  She admits to back pain, malodorous urine, and pressure.  Review of Systems  Constitutional:  Negative for chills and fever.  Respiratory:  Negative for cough and shortness of breath.   Cardiovascular:  Negative for chest pain.  Genitourinary:  Positive for dysuria.  Musculoskeletal:  Positive for back pain.  Neurological:  Negative for headaches.        Objective:    BP (!) 156/78   Pulse 70   Temp 98 F (36.7 C) (Oral)   Ht 5\' 6"  (1.676 m)   Wt 132 lb (59.9 kg)   SpO2 99%   BMI 21.31 kg/m    Physical Exam Vitals and nursing note reviewed.  Constitutional:      General: She is not in acute distress.    Appearance: Normal appearance.  HENT:     Head: Normocephalic and atraumatic.     Right Ear: External ear normal.     Left Ear: External ear normal.     Nose: Nose normal.  Eyes:     Conjunctiva/sclera: Conjunctivae normal.  Cardiovascular:     Rate and Rhythm: Normal rate and regular rhythm.  Pulmonary:     Effort: Pulmonary effort is normal.     Breath sounds: Normal breath sounds.  Abdominal:     Tenderness: There is no right CVA tenderness or left CVA tenderness.  Neurological:     General: No focal deficit present.     Mental Status: She is alert and oriented to person, place, and time.  Psychiatric:        Mood and Affect: Mood normal.        Behavior: Behavior normal.        Thought Content: Thought content normal.        Judgment: Judgment normal.     Results for orders placed or performed in visit on 01/12/23  POCT urine pregnancy  Result Value Ref Range   Preg Test, Ur Negative Negative  POCT URINALYSIS DIP (CLINITEK)  Result Value Ref Range   Color, UA straw (A) yellow   Clarity, UA cloudy (A) clear   Glucose, UA negative  negative mg/dL   Bilirubin, UA negative negative   Ketones, POC UA negative negative mg/dL   Spec Grav, UA 1.025 1.010 - 1.025   Blood, UA negative negative   pH, UA 7.0 5.0 - 8.0   POC PROTEIN,UA negative negative, trace   Urobilinogen, UA 0.2 0.2 or 1.0 E.U./dL   Nitrite, UA Positive (A) Negative   Leukocytes, UA Trace (A) Negative        Assessment & Plan:   Problem List Items Addressed This Visit       Genitourinary   Acute cystitis without hematuria - Primary    Based on symptoms along with point-of-care UA we will go ahead and treat patient for UTI.  Did discuss urinating after intercourse.      Relevant Medications   nitrofurantoin, macrocrystal-monohydrate, (MACROBID) 100 MG capsule     Other   Dysuria   Relevant Orders   POCT urine pregnancy (Completed)   Urine Culture   POCT URINALYSIS DIP (CLINITEK) (Completed)   Spotting between menses    - upt test: negative  Meds ordered this encounter  Medications   nitrofurantoin, macrocrystal-monohydrate, (MACROBID) 100 MG capsule    Sig: Take 1 capsule (100 mg total) by mouth 2 (two) times daily for 7 days.    Dispense:  14 capsule    Refill:  0    Return if symptoms worsen or fail to improve.  Owens Loffler, DO

## 2023-01-15 LAB — URINE CULTURE
MICRO NUMBER:: 14736749
SPECIMEN QUALITY:: ADEQUATE

## 2023-03-31 ENCOUNTER — Telehealth: Payer: Self-pay

## 2023-03-31 NOTE — Telephone Encounter (Signed)
LVM for patient to call back [phone], or to call PCP office to schedule follow up apt. AS, CMA  

## 2023-11-14 ENCOUNTER — Encounter: Payer: Self-pay | Admitting: Physician Assistant

## 2023-11-17 NOTE — Telephone Encounter (Signed)
Patient went to the urgent care.

## 2024-06-21 ENCOUNTER — Encounter: Payer: Self-pay | Admitting: Sports Medicine

## 2024-10-06 ENCOUNTER — Ambulatory Visit
Admission: EM | Admit: 2024-10-06 | Discharge: 2024-10-06 | Disposition: A | Discharge status: Home / Self Care | Source: Home / Self Care

## 2024-10-06 DIAGNOSIS — J019 Acute sinusitis, unspecified: Secondary | ICD-10-CM | POA: Diagnosis not present

## 2024-10-06 MED ORDER — PREDNISONE 20 MG PO TABS: 40.0000 mg | ORAL_TABLET | Freq: Every day | ORAL | 0 refills | Status: AC

## 2024-10-06 MED ORDER — HYDROCOD POLI-CHLORPHE POLI ER 10-8 MG/5ML PO SUER: 5.0000 mL | Freq: Two times a day (BID) | ORAL | 0 refills | Status: AC | PRN

## 2024-10-06 MED ORDER — AMOXICILLIN-POT CLAVULANATE 875-125 MG PO TABS: 1.0000 | ORAL_TABLET | Freq: Two times a day (BID) | ORAL | 0 refills | Status: AC

## 2024-10-06 NOTE — Discharge Instructions (Signed)
 Take the antibiotic 2 times a day Take prednisone  once a day May continue cough or cold medicines as needed I have given you prescription cough medicine but it may cause drowsiness.  Take at bedtime Drink lots of water

## 2024-10-06 NOTE — ED Provider Notes (Signed)
 [facility]    CSN: [id number] Arrival date & time: 10/06/24  1934      History   Chief Complaint Chief Complaint  Patient presents with   Cough    HPI Audrey Larson is a 23 y.o. female.   Cough cold, runny nose, sinus congestion, headache, symptoms for 10 days not responding to over-the-counter medication.  Concern for sinus infection.  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 No pertinent surgical history.  OB History   No obstetric history on file.      Home Medications    Prior to Admission medications  Medication Sig Start Date End Date Taking? Authorizing Provider  amoxicillin -clavulanate (AUGMENTIN ) 875-125 MG tablet Take 1 tablet by mouth every 12 (twelve) hours. 10/06/24  Yes Maranda Jamee Jacob, MD  chlorpheniramine-HYDROcodone  (TUSSIONEX) 10-8 MG/5ML Take 5 mLs by mouth every 12 (twelve) hours as needed for cough. 10/06/24  Yes Maranda Jamee Jacob, MD  predniSONE  (DELTASONE ) 20 MG tablet Take 2 tablets (40 mg total) by mouth daily with breakfast. 10/06/24  Yes Maranda Jamee Jacob, MD  amitriptyline  (ELAVIL ) 25 MG tablet TAKE 1 TABLET (25 MG TOTAL) BY MOUTH AT BEDTIME. APPT FOR FURTHER REFILLS 07/16/22   Breeback, Jade L, PA-C  lamoTRIgine (LAMICTAL) 100 MG tablet Take by mouth. 06/07/22    [provider]  Mometasone  Furoate 110 MCG/INH AEPB Inhale 1 Inhaler into the lungs 2 (two) times daily. 01/24/19   Marsa Edelman, DO    Family History Family History  Problem Relation Age of Onset   Thyroid  disease Mother     Social History Social History[1]   Allergies   Ceftriaxone and Ceftriaxone sodium   Review of Systems Review of Systems See HPI  Physical Exam Triage Vital Signs ED Triage Vitals  Encounter Vitals Group     BP 10/06/24 1938 131/73     Girls Systolic BP Percentile --      Girls Diastolic BP Percentile --      Boys Systolic BP Percentile --      Boys Diastolic BP Percentile --      Pulse Rate 10/06/24 1938 (!) 105     Resp 10/06/24 1938 17     Temp 10/06/24 1938 98.2 F (36.8 C)     Temp Source 10/06/24 1938 Oral     SpO2 10/06/24 1938 98 %     Weight --      Height --      Head Circumference --      Peak Flow --      Pain Score 10/06/24 1940 0     Pain Loc --      Pain Education --      Exclude from Growth Chart --    No data found.  Updated Vital Signs BP 131/73 (BP Location: Right  Arm)   Pulse (!) 105   Temp 98.2 F (36.8 C) (Oral)   Resp 17   LMP 09/13/2024 (Approximate)   SpO2 98%       Physical Exam Constitutional:      General: She is not in acute distress.    Appearance: She is well-developed. She is ill-appearing.     Comments: Appears tired  HENT:     Head: Normocephalic and atraumatic.     Right Ear: Tympanic membrane normal.     Left Ear: Tympanic membrane normal.     Nose: Congestion and rhinorrhea present.     Mouth/Throat:     Mouth: Mucous membranes are moist.     Pharynx: Posterior oropharyngeal erythema present.     Comments: Facial sinuses are tender Eyes:     Conjunctiva/sclera: Conjunctivae normal.     Pupils: Pupils are equal, round, and reactive to light.  Cardiovascular:     Rate and Rhythm: Normal rate.     Heart sounds: Normal heart sounds.  Pulmonary:     Effort: Pulmonary  effort is normal. No respiratory distress.     Breath sounds: Normal breath sounds.  Musculoskeletal:        General: Normal range of motion.     Cervical back: Normal range of motion.  Skin:    General: Skin is warm and dry.  Neurological:     Mental Status: She is alert.      UC Treatments / Results  Labs (all labs ordered are listed, but only abnormal results are displayed) Labs Reviewed - No data to display  EKG   Radiology No results found.  Procedures Procedures (including critical care time)  Medications Ordered in UC Medications - No data to display  Initial Impression / Assessment and Plan / UC Course  I have reviewed the triage vital signs and the nursing notes.  Pertinent labs & imaging results that were available during my care of the patient were reviewed by me and considered in my medical decision making (see chart for details).     Final Clinical Impressions(s) / UC Diagnoses   Final diagnoses:  Acute sinusitis with symptoms greater than 10 days     Discharge Instructions      Take the antibiotic 2 times a day Take prednisone  once a day May continue cough or cold medicines as needed I have given you prescription cough medicine but it may cause drowsiness.  Take at bedtime Drink lots of water   ED Prescriptions     Medication Sig Dispense Auth. Provider   amoxicillin -clavulanate (AUGMENTIN ) 875-125 MG tablet Take 1 tablet by mouth every 12 (twelve) hours. 14 tablet Maranda Jamee Jacob, MD   predniSONE  (DELTASONE ) 20 MG tablet Take 2 tablets (40 mg total) by mouth daily with breakfast. 10 tablet Maranda Jamee Jacob, MD   chlorpheniramine-HYDROcodone  (TUSSIONEX) 10-8 MG/5ML Take 5 mLs by mouth every 12 (twelve) hours as needed for cough. 115 mL Maranda Jamee Jacob, MD      PDMP not reviewed this encounter.    [1]  Social History Tobacco Use   Smoking status: Never   Smokeless tobacco: Never  Vaping Use   Vaping status: Never Used   Substance Use Topics   Alcohol use: No   Drug use: No     Maranda Jamee Jacob, MD 10/06/24 2001

## 2024-10-06 NOTE — ED Triage Notes (Addendum)
 Pt c/o cough and intermittent HA x 10 days. Cough making HA worse and sometimes coughing so much she vomits. Fever first few days of sxs which have resolved. Taking mucinex, dayquil and tylenol  prn. Hx of asthma and pneumonia.
